# Patient Record
Sex: Female | Born: 1965 | Race: White | Hispanic: No | Marital: Married | Smoking: Never smoker
Health system: Southern US, Community
[De-identification: ages and names within clinical notes are randomized; demographics above are authoritative.]

## PROBLEM LIST (undated history)

## (undated) DIAGNOSIS — F32A Depression, unspecified: Secondary | ICD-10-CM

## (undated) DIAGNOSIS — J302 Other seasonal allergic rhinitis: Secondary | ICD-10-CM

## (undated) DIAGNOSIS — F329 Major depressive disorder, single episode, unspecified: Secondary | ICD-10-CM

## (undated) DIAGNOSIS — F419 Anxiety disorder, unspecified: Secondary | ICD-10-CM

## (undated) DIAGNOSIS — I10 Essential (primary) hypertension: Secondary | ICD-10-CM

## (undated) DIAGNOSIS — K219 Gastro-esophageal reflux disease without esophagitis: Secondary | ICD-10-CM

## (undated) HISTORY — PX: KNEE SURGERY: SHX244

## (undated) HISTORY — DX: Gastro-esophageal reflux disease without esophagitis: K21.9

## (undated) HISTORY — DX: Major depressive disorder, single episode, unspecified: F32.9

## (undated) HISTORY — PX: ANKLE SURGERY: SHX546

## (undated) HISTORY — DX: Anxiety disorder, unspecified: F41.9

## (undated) HISTORY — DX: Other seasonal allergic rhinitis: J30.2

## (undated) HISTORY — PX: TONSILLECTOMY: SUR1361

## (undated) HISTORY — DX: Depression, unspecified: F32.A

---

## 1999-06-12 ENCOUNTER — Other Ambulatory Visit: Admission: RE | Admit: 1999-06-12 | Discharge: 1999-06-12 | Payer: Self-pay | Admitting: Family Medicine

## 2004-03-16 ENCOUNTER — Other Ambulatory Visit: Admission: RE | Admit: 2004-03-16 | Discharge: 2004-03-16 | Payer: Self-pay | Admitting: *Deleted

## 2005-04-04 ENCOUNTER — Other Ambulatory Visit: Admission: RE | Admit: 2005-04-04 | Discharge: 2005-04-04 | Payer: Self-pay | Admitting: *Deleted

## 2006-09-25 ENCOUNTER — Other Ambulatory Visit: Admission: RE | Admit: 2006-09-25 | Discharge: 2006-09-25 | Payer: Self-pay | Admitting: Obstetrics and Gynecology

## 2007-09-25 ENCOUNTER — Other Ambulatory Visit: Admission: RE | Admit: 2007-09-25 | Discharge: 2007-09-25 | Payer: Self-pay | Admitting: *Deleted

## 2008-09-27 ENCOUNTER — Other Ambulatory Visit: Admission: RE | Admit: 2008-09-27 | Discharge: 2008-09-27 | Payer: Self-pay | Admitting: Family Medicine

## 2009-10-05 ENCOUNTER — Other Ambulatory Visit: Admission: RE | Admit: 2009-10-05 | Discharge: 2009-10-05 | Payer: Self-pay | Admitting: Family Medicine

## 2010-12-12 ENCOUNTER — Encounter
Admission: RE | Admit: 2010-12-12 | Discharge: 2010-12-12 | Payer: Self-pay | Source: Home / Self Care | Attending: Family Medicine | Admitting: Family Medicine

## 2010-12-20 ENCOUNTER — Encounter: Payer: Self-pay | Admitting: Family Medicine

## 2011-11-02 ENCOUNTER — Other Ambulatory Visit: Payer: Self-pay | Admitting: Family Medicine

## 2011-11-02 ENCOUNTER — Other Ambulatory Visit (HOSPITAL_COMMUNITY)
Admission: RE | Admit: 2011-11-02 | Discharge: 2011-11-02 | Disposition: A | Discharge status: Home / Self Care | Payer: Commercial Indemnity | Source: Ambulatory Visit | Attending: Family Medicine | Admitting: Family Medicine

## 2011-11-02 DIAGNOSIS — Z124 Encounter for screening for malignant neoplasm of cervix: Secondary | ICD-10-CM | POA: Insufficient documentation

## 2011-11-02 DIAGNOSIS — Z1159 Encounter for screening for other viral diseases: Secondary | ICD-10-CM | POA: Insufficient documentation

## 2012-11-05 ENCOUNTER — Other Ambulatory Visit (HOSPITAL_COMMUNITY)
Admission: RE | Admit: 2012-11-05 | Discharge: 2012-11-05 | Disposition: A | Discharge status: Home / Self Care | Payer: Commercial Indemnity | Source: Ambulatory Visit | Attending: Family Medicine | Admitting: Family Medicine

## 2012-11-05 ENCOUNTER — Other Ambulatory Visit: Payer: Self-pay | Admitting: Family Medicine

## 2012-11-05 DIAGNOSIS — Z124 Encounter for screening for malignant neoplasm of cervix: Secondary | ICD-10-CM | POA: Insufficient documentation

## 2012-11-05 DIAGNOSIS — Z1151 Encounter for screening for human papillomavirus (HPV): Secondary | ICD-10-CM | POA: Insufficient documentation

## 2013-05-06 ENCOUNTER — Other Ambulatory Visit: Payer: Self-pay | Admitting: Family Medicine

## 2013-05-06 DIAGNOSIS — Z1231 Encounter for screening mammogram for malignant neoplasm of breast: Secondary | ICD-10-CM

## 2013-05-21 ENCOUNTER — Ambulatory Visit
Admission: RE | Admit: 2013-05-21 | Discharge: 2013-05-21 | Disposition: A | Discharge status: Home / Self Care | Payer: Commercial Indemnity | Source: Ambulatory Visit | Attending: Family Medicine | Admitting: Family Medicine

## 2013-05-21 ENCOUNTER — Ambulatory Visit: Payer: Commercial Indemnity

## 2013-05-21 DIAGNOSIS — Z1231 Encounter for screening mammogram for malignant neoplasm of breast: Secondary | ICD-10-CM

## 2013-11-23 ENCOUNTER — Other Ambulatory Visit (HOSPITAL_COMMUNITY)
Admission: RE | Admit: 2013-11-23 | Discharge: 2013-11-23 | Disposition: A | Discharge status: Home / Self Care | Payer: Commercial Indemnity | Source: Ambulatory Visit | Attending: Family Medicine | Admitting: Family Medicine

## 2013-11-23 ENCOUNTER — Other Ambulatory Visit: Payer: Self-pay | Admitting: Family Medicine

## 2013-11-23 DIAGNOSIS — Z124 Encounter for screening for malignant neoplasm of cervix: Secondary | ICD-10-CM | POA: Insufficient documentation

## 2014-06-30 ENCOUNTER — Other Ambulatory Visit: Payer: Self-pay

## 2014-06-30 DIAGNOSIS — Z1231 Encounter for screening mammogram for malignant neoplasm of breast: Secondary | ICD-10-CM

## 2014-07-07 ENCOUNTER — Ambulatory Visit
Admission: RE | Admit: 2014-07-07 | Discharge: 2014-07-07 | Disposition: A | Discharge status: Home / Self Care | Payer: BC Managed Care – PPO | Source: Ambulatory Visit

## 2014-07-07 DIAGNOSIS — Z1231 Encounter for screening mammogram for malignant neoplasm of breast: Secondary | ICD-10-CM

## 2014-07-13 ENCOUNTER — Other Ambulatory Visit: Payer: Self-pay | Admitting: Family Medicine

## 2014-07-13 DIAGNOSIS — R928 Other abnormal and inconclusive findings on diagnostic imaging of breast: Secondary | ICD-10-CM

## 2014-07-20 ENCOUNTER — Ambulatory Visit
Admission: RE | Admit: 2014-07-20 | Discharge: 2014-07-20 | Disposition: A | Discharge status: Home / Self Care | Payer: BC Managed Care – PPO | Source: Ambulatory Visit | Attending: Family Medicine | Admitting: Family Medicine

## 2014-07-20 DIAGNOSIS — R928 Other abnormal and inconclusive findings on diagnostic imaging of breast: Secondary | ICD-10-CM

## 2015-12-23 ENCOUNTER — Other Ambulatory Visit: Payer: Self-pay

## 2015-12-23 DIAGNOSIS — Z1231 Encounter for screening mammogram for malignant neoplasm of breast: Secondary | ICD-10-CM

## 2015-12-30 ENCOUNTER — Other Ambulatory Visit: Payer: Self-pay | Admitting: Family Medicine

## 2015-12-30 ENCOUNTER — Other Ambulatory Visit (HOSPITAL_COMMUNITY)
Admission: RE | Admit: 2015-12-30 | Discharge: 2015-12-30 | Disposition: A | Discharge status: Home / Self Care | Payer: BC Managed Care – PPO | Source: Ambulatory Visit | Attending: Family Medicine | Admitting: Family Medicine

## 2015-12-30 DIAGNOSIS — Z01419 Encounter for gynecological examination (general) (routine) without abnormal findings: Secondary | ICD-10-CM | POA: Diagnosis present

## 2015-12-30 DIAGNOSIS — Z1151 Encounter for screening for human papillomavirus (HPV): Secondary | ICD-10-CM | POA: Diagnosis not present

## 2016-01-02 ENCOUNTER — Other Ambulatory Visit: Payer: Self-pay | Admitting: Gastroenterology

## 2016-01-03 LAB — CYTOLOGY - PAP

## 2016-01-04 ENCOUNTER — Ambulatory Visit
Admission: RE | Admit: 2016-01-04 | Discharge: 2016-01-04 | Disposition: A | Discharge status: Home / Self Care | Payer: Self-pay | Source: Ambulatory Visit

## 2016-01-04 DIAGNOSIS — Z1231 Encounter for screening mammogram for malignant neoplasm of breast: Secondary | ICD-10-CM

## 2016-10-02 DIAGNOSIS — J069 Acute upper respiratory infection, unspecified: Secondary | ICD-10-CM | POA: Diagnosis not present

## 2016-12-19 ENCOUNTER — Other Ambulatory Visit: Payer: Self-pay | Admitting: Family Medicine

## 2016-12-19 DIAGNOSIS — Z1231 Encounter for screening mammogram for malignant neoplasm of breast: Secondary | ICD-10-CM

## 2017-01-07 ENCOUNTER — Ambulatory Visit: Payer: BC Managed Care – PPO

## 2017-01-07 ENCOUNTER — Ambulatory Visit: Payer: Self-pay

## 2017-01-22 DIAGNOSIS — L239 Allergic contact dermatitis, unspecified cause: Secondary | ICD-10-CM | POA: Diagnosis not present

## 2017-01-22 DIAGNOSIS — Z0001 Encounter for general adult medical examination with abnormal findings: Secondary | ICD-10-CM | POA: Diagnosis not present

## 2017-01-22 DIAGNOSIS — J309 Allergic rhinitis, unspecified: Secondary | ICD-10-CM | POA: Diagnosis not present

## 2017-01-22 DIAGNOSIS — K219 Gastro-esophageal reflux disease without esophagitis: Secondary | ICD-10-CM | POA: Diagnosis not present

## 2017-01-22 DIAGNOSIS — F411 Generalized anxiety disorder: Secondary | ICD-10-CM | POA: Diagnosis not present

## 2017-01-22 DIAGNOSIS — E782 Mixed hyperlipidemia: Secondary | ICD-10-CM | POA: Diagnosis not present

## 2017-01-24 ENCOUNTER — Other Ambulatory Visit: Payer: Self-pay | Admitting: Family Medicine

## 2017-01-24 ENCOUNTER — Ambulatory Visit
Admission: RE | Admit: 2017-01-24 | Discharge: 2017-01-24 | Disposition: A | Discharge status: Home / Self Care | Payer: BLUE CROSS/BLUE SHIELD | Source: Ambulatory Visit | Attending: Family Medicine | Admitting: Family Medicine

## 2017-01-24 DIAGNOSIS — Z1231 Encounter for screening mammogram for malignant neoplasm of breast: Secondary | ICD-10-CM

## 2017-04-16 ENCOUNTER — Ambulatory Visit (INDEPENDENT_AMBULATORY_CARE_PROVIDER_SITE_OTHER): Payer: BLUE CROSS/BLUE SHIELD | Admitting: Obstetrics & Gynecology

## 2017-04-16 ENCOUNTER — Encounter: Payer: Self-pay | Admitting: Obstetrics & Gynecology

## 2017-04-16 VITALS — BP 138/90 | Ht 61.5 in | Wt 205.0 lb

## 2017-04-16 DIAGNOSIS — Z1151 Encounter for screening for human papillomavirus (HPV): Secondary | ICD-10-CM

## 2017-04-16 DIAGNOSIS — N950 Postmenopausal bleeding: Secondary | ICD-10-CM | POA: Diagnosis not present

## 2017-04-16 DIAGNOSIS — Z01411 Encounter for gynecological examination (general) (routine) with abnormal findings: Secondary | ICD-10-CM | POA: Diagnosis not present

## 2017-04-16 DIAGNOSIS — E669 Obesity, unspecified: Secondary | ICD-10-CM

## 2017-04-16 LAB — TSH: TSH: 1.95 mIU/L

## 2017-04-16 NOTE — Progress Notes (Signed)
Lisa Terry 03/03/1966 [id number]   History:    51 y.o.  G2P2L3 married x 30+ yrs.  3 sons doing well.  Has 8 grand-children  RP:  New patient presenting for annual gyn exam and PMB  HPI:  Menopause x 2 yrs with mild hot flashes/night sweats, but well tolerated without HRT.  X 2 weeks, has vaginal spotting, then vaginal bleeding like a rather heavy period.  Severe pelvic cramps at that time.  No current pelvic pain.  Vaginal bleeding now resolved.  Obesity with BMI at 38.11.  Breasts wnl.  Mictions normal.  BMs normal.  Past medical history,surgical history, family history and social history were all reviewed and documented in the EPIC chart.  Gynecologic History No LMP recorded. Patient is postmenopausal. Contraception: post menopausal status Last Pap: 2017. Results were: normal Last mammogram: 2018. Results were: normal  Obstetric History OB History  Gravida Para Term Preterm AB Living  2 2       3   SAB TAB Ectopic Multiple Live Births        1      # Outcome Date GA Lbr Len/2nd Weight Sex Delivery Anes PTL Lv  2 Para           1 Para                ROS: A ROS was performed and pertinent positives and negatives are included in the history.  GENERAL: No fevers or chills. HEENT: No change in vision, no earache, sore throat or sinus congestion. NECK: No pain or stiffness. CARDIOVASCULAR: No chest pain or pressure. No palpitations. PULMONARY: No shortness of breath, cough or wheeze. GASTROINTESTINAL: No abdominal pain, nausea, vomiting or diarrhea, melena or bright red blood per rectum. GENITOURINARY: No urinary frequency, urgency, hesitancy or dysuria. MUSCULOSKELETAL: No joint or muscle pain, no back pain, no recent trauma. DERMATOLOGIC: No rash, no itching, no lesions. ENDOCRINE: No polyuria, polydipsia, no heat or cold intolerance. No recent change in weight. HEMATOLOGICAL: No anemia or easy bruising or bleeding. NEUROLOGIC: No headache, seizures, numbness, tingling or weakness.  PSYCHIATRIC: No depression, no loss of interest in normal activity or change in sleep pattern.     Exam:   BP 138/90   Ht 5' 1.5" (1.562 m)   Wt 205 lb (93 kg)   BMI 38.11 kg/m   Body mass index is 38.11 kg/m.  General appearance : Well developed well nourished female. No acute distress HEENT: Eyes: no retinal hemorrhage or exudates,  Neck supple, trachea midline, no carotid bruits, no thyroidmegaly Lungs: Clear to auscultation, no rhonchi or wheezes, or rib retractions  Heart: Regular rate and rhythm, no murmurs or gallops Breast:Examined in sitting and supine position were symmetrical in appearance, no palpable masses or tenderness,  no skin retraction, no nipple inversion, no nipple discharge, no skin discoloration, no axillary or supraclavicular lymphadenopathy Abdomen: no palpable masses or tenderness, no rebound or guarding Extremities: no edema or skin discoloration or tenderness  Pelvic:  Bartholin, Urethra, Skene Glands: Within normal limits             Vagina: No gross lesions or discharge  Cervix: No gross lesions or discharge.  Pap/HPV done.  Uterus  AV, normal size, shape and consistency, non-tender and mobile  Adnexa  Without masses or tenderness  Anus and perineum  normal    Assessment/Plan:  51 y.o. female for annual exam   1. Encounter for gynecological examination with abnormal finding Normal gyn exam except  mild menopausal atrophic vaginitis.  Pap/HPV done.  2. PMB (postmenopausal bleeding) Causes of PMB including fibroids, polyps, endometrial pathology and isolated ovulation discussed.  Investigation reviewed with Pelvic US at f/u with EBx if Endometrial line >5 mm.   - FSH - TSH - US Transvaginal Non-OB; Future  3. Obesity BMI 38+ Low carb diet and physical activity reviewed.  Counseling on above issues >50% x 20 minutes.  Princess Bruins MD, 9:05 AM 04/16/2017

## 2017-04-16 NOTE — Patient Instructions (Addendum)
1. Encounter for gynecological examination with abnormal finding Normal gyn exam except mild menopausal atrophic vaginitis.  Pap/HPV done.  2. PMB (postmenopausal bleeding) Causes of PMB including fibroids, polyps, endometrial pathology and isolated ovulation discussed.  Investigation reviewed with Pelvic US at f/u with EBx if Endometrial line >5 mm.   - FSH - TSH - US Transvaginal Non-OB; Future  3. Obesity BMI 38+ Low carb diet and physical activity reviewed.  Lisa Terry, it was a pleasure to meet you today!  See you soon with the Pelvic US.  I will inform you of the results on the labs and pap test as soon as available.

## 2017-04-16 NOTE — Addendum Note (Signed)
Addended by: Thurnell Garbe A on: 04/16/2017 09:49 AM   Modules accepted: Orders

## 2017-04-17 LAB — FOLLICLE STIMULATING HORMONE: FSH: 51.4 m[IU]/mL

## 2017-04-19 LAB — PAP, TP IMAGING W/ HPV RNA, RFLX HPV TYPE 16,18/45: HPV mRNA, High Risk: NOT DETECTED

## 2017-04-24 ENCOUNTER — Ambulatory Visit (INDEPENDENT_AMBULATORY_CARE_PROVIDER_SITE_OTHER): Payer: BLUE CROSS/BLUE SHIELD | Admitting: Obstetrics & Gynecology

## 2017-04-24 ENCOUNTER — Other Ambulatory Visit: Payer: Self-pay | Admitting: Obstetrics & Gynecology

## 2017-04-24 ENCOUNTER — Ambulatory Visit (INDEPENDENT_AMBULATORY_CARE_PROVIDER_SITE_OTHER): Payer: BLUE CROSS/BLUE SHIELD

## 2017-04-24 DIAGNOSIS — R938 Abnormal findings on diagnostic imaging of other specified body structures: Secondary | ICD-10-CM

## 2017-04-24 DIAGNOSIS — N950 Postmenopausal bleeding: Secondary | ICD-10-CM

## 2017-04-24 DIAGNOSIS — D251 Intramural leiomyoma of uterus: Secondary | ICD-10-CM

## 2017-04-24 DIAGNOSIS — R9389 Abnormal findings on diagnostic imaging of other specified body structures: Secondary | ICD-10-CM

## 2017-04-24 NOTE — Patient Instructions (Signed)
1. Post-menopausal bleeding Thickened Endometrium with focal area.  EBx pending.    2. Thickened endometrium EBx done today, no Cx.  Focus by [place].  F/U Sonohysto r/o polyp.  Good to see you today Lisa Terry.  I will inform you of the results as soon as available.

## 2017-04-24 NOTE — Progress Notes (Signed)
    Lisa Terry 08-08-1966 [id number]        51 y.o.  G2P2  RP:  PMB for Pelvic US/EBx  Past medical history,surgical history, problem list, medications, allergies, family history and social history were all reviewed and documented in the EPIC chart.  Directed ROS with pertinent positives and negatives documented in the history of present illness/assessment and plan.  Exam:  There were no vitals filed for this visit. General appearance:  Normal  Pelvic US today:  T/V and T/A:  Anteverted Uterus with thickened endometrial line at 12.5 mm.  A 9 mm focus is present, but CFD neg.  Intramural fibroid 10 x 10 mm.  Right Ovary normal.  Left Ovary located behind Uterus, seen by T/A, normal.  No FF in CDS.  Endometrial Bx done after Betadine prep and Hurricane spray.  Tenaculum applied on anterior lip of cervix.  Canula entered easily.  Good specimen.  Sent to patho.  Instruments removed from vagina.  Well tolerated.  Assessment/Plan:  51 y.o. G2P2  1. Post-menopausal bleeding Thickened Endometrium with focal area.  EBx pending.    2. Thickened endometrium EBx done today, no Cx.  Focus by [place].  F/U Sonohysto r/o polyp.  Counseling on above issues >50% x 15 minutes.  Princess Bruins MD, 10:25 AM 04/24/2017

## 2017-05-15 ENCOUNTER — Ambulatory Visit (INDEPENDENT_AMBULATORY_CARE_PROVIDER_SITE_OTHER): Payer: BLUE CROSS/BLUE SHIELD

## 2017-05-15 ENCOUNTER — Ambulatory Visit (INDEPENDENT_AMBULATORY_CARE_PROVIDER_SITE_OTHER): Payer: BLUE CROSS/BLUE SHIELD | Admitting: Obstetrics & Gynecology

## 2017-05-15 DIAGNOSIS — R938 Abnormal findings on diagnostic imaging of other specified body structures: Secondary | ICD-10-CM

## 2017-05-15 DIAGNOSIS — N950 Postmenopausal bleeding: Secondary | ICD-10-CM

## 2017-05-15 DIAGNOSIS — R9389 Abnormal findings on diagnostic imaging of other specified body structures: Secondary | ICD-10-CM

## 2017-05-15 NOTE — Patient Instructions (Signed)
1. Thickened endometrium EBx benign.  Sonohysto:  No IU defect.  Patient reassured.  No complication.  F/U Annual/Gyn exam.  Good to see you today!

## 2017-05-15 NOTE — Progress Notes (Signed)
    Lisa Terry 09-27-1966 [id number]        51 y.o.  G2P2   RP:  Sonohysto for Thickened endometrium with focus on [place]  HPI:  No PMB x last visit.  No pelvic pain.  Past medical history,surgical history, problem list, medications, allergies, family history and social history were all reviewed and documented in the EPIC chart.  Directed ROS with pertinent positives and negatives documented in the history of present illness/assessment and plan.  Exam:  There were no vitals filed for this visit. General appearance:  Normal                                                                    Sono Infusion Hysterogram ( procedure note)   The initial transvaginal ultrasound demonstrated the following 04/24/2017: T/V and T/A:  Anteverted Uterus with thickened endometrial line at 12.5 mm.  A 9 mm focus is present, but CFD neg.  Intramural fibroid 10 x 10 mm.  Right Ovary normal.  Left Ovary located behind Uterus, seen by T/A, normal.  No FF in CDS.  EBx 04/24/2017: SCANTY SMALL FRAGMENTS OF BENIGN WEAKLY PROLIFERATIVE ENDOMETRIUM.  The speculum  was inserted and the cervix cleansed with Betadine solution after confirming that patient has no allergies and Hurricane was sprayed on the cervix.  The tenaculum was applied on the anterior lip of the cervix.  A small sonohysterography catheter was utilized.  Insertion was facilitated with ring forceps, using a spear-like motion the catheter was inserted to the fundus of the uterus. The tenaculum and speculum were then removed carefully to avoid dislodging the catheter. The catheter was flushed with sterile saline delete prior to insertion to rid it of small amounts of air.  The sterile saline solution was infused into the uterine cavity as a vaginal ultrasound probe was then placed in the vagina for full visualization of the uterine cavity from a transvaginal approach. The following was noted:  SonoHysto:  Following injection of saline, endometrium filled and  no defect seen.  Endometrial line 6.6 mm.   The catheter was then removed after retrieving some of the saline from the intrauterine cavity.  Patient tolerated procedure well. She had taken NSAIDs prior to procedure.  Assessment/Plan:  51 y.o. G2P2   1. Thickened endometrium EBx benign.  Sonohysto:  No IU defect.  Patient reassured.  No complication.  F/U Annual/Gyn exam.  Princess Bruins MD, 4:27 PM 05/15/2017

## 2017-07-24 DIAGNOSIS — F411 Generalized anxiety disorder: Secondary | ICD-10-CM | POA: Diagnosis not present

## 2017-07-24 DIAGNOSIS — L239 Allergic contact dermatitis, unspecified cause: Secondary | ICD-10-CM | POA: Diagnosis not present

## 2017-07-24 DIAGNOSIS — E782 Mixed hyperlipidemia: Secondary | ICD-10-CM | POA: Diagnosis not present

## 2017-07-24 DIAGNOSIS — J309 Allergic rhinitis, unspecified: Secondary | ICD-10-CM | POA: Diagnosis not present

## 2017-08-21 DIAGNOSIS — Z23 Encounter for immunization: Secondary | ICD-10-CM | POA: Diagnosis not present

## 2017-08-21 DIAGNOSIS — I10 Essential (primary) hypertension: Secondary | ICD-10-CM | POA: Diagnosis not present

## 2017-08-21 DIAGNOSIS — R7301 Impaired fasting glucose: Secondary | ICD-10-CM | POA: Diagnosis not present

## 2017-09-24 DIAGNOSIS — I10 Essential (primary) hypertension: Secondary | ICD-10-CM | POA: Diagnosis not present

## 2018-04-30 DIAGNOSIS — F411 Generalized anxiety disorder: Secondary | ICD-10-CM | POA: Diagnosis not present

## 2018-04-30 DIAGNOSIS — E782 Mixed hyperlipidemia: Secondary | ICD-10-CM | POA: Diagnosis not present

## 2018-04-30 DIAGNOSIS — I10 Essential (primary) hypertension: Secondary | ICD-10-CM | POA: Diagnosis not present

## 2018-04-30 DIAGNOSIS — K219 Gastro-esophageal reflux disease without esophagitis: Secondary | ICD-10-CM | POA: Diagnosis not present

## 2018-05-01 DIAGNOSIS — E782 Mixed hyperlipidemia: Secondary | ICD-10-CM | POA: Diagnosis not present

## 2018-05-01 DIAGNOSIS — I10 Essential (primary) hypertension: Secondary | ICD-10-CM | POA: Diagnosis not present

## 2018-07-30 DIAGNOSIS — E782 Mixed hyperlipidemia: Secondary | ICD-10-CM | POA: Diagnosis not present

## 2018-07-30 DIAGNOSIS — I10 Essential (primary) hypertension: Secondary | ICD-10-CM | POA: Diagnosis not present

## 2018-07-30 DIAGNOSIS — F411 Generalized anxiety disorder: Secondary | ICD-10-CM | POA: Diagnosis not present

## 2018-12-16 DIAGNOSIS — L718 Other rosacea: Secondary | ICD-10-CM | POA: Diagnosis not present

## 2018-12-16 DIAGNOSIS — L710 Perioral dermatitis: Secondary | ICD-10-CM | POA: Diagnosis not present

## 2019-01-27 DIAGNOSIS — L718 Other rosacea: Secondary | ICD-10-CM | POA: Diagnosis not present

## 2019-05-01 DIAGNOSIS — Z1159 Encounter for screening for other viral diseases: Secondary | ICD-10-CM | POA: Diagnosis not present

## 2019-05-01 DIAGNOSIS — K219 Gastro-esophageal reflux disease without esophagitis: Secondary | ICD-10-CM | POA: Diagnosis not present

## 2019-05-01 DIAGNOSIS — I10 Essential (primary) hypertension: Secondary | ICD-10-CM | POA: Diagnosis not present

## 2019-05-01 DIAGNOSIS — E782 Mixed hyperlipidemia: Secondary | ICD-10-CM | POA: Diagnosis not present

## 2019-05-01 DIAGNOSIS — F411 Generalized anxiety disorder: Secondary | ICD-10-CM | POA: Diagnosis not present

## 2019-05-01 DIAGNOSIS — R7301 Impaired fasting glucose: Secondary | ICD-10-CM | POA: Diagnosis not present

## 2019-05-14 ENCOUNTER — Other Ambulatory Visit: Payer: Self-pay | Admitting: Family Medicine

## 2019-05-14 DIAGNOSIS — Z1231 Encounter for screening mammogram for malignant neoplasm of breast: Secondary | ICD-10-CM

## 2019-06-24 ENCOUNTER — Other Ambulatory Visit: Payer: Self-pay

## 2019-06-24 ENCOUNTER — Ambulatory Visit
Admission: RE | Admit: 2019-06-24 | Discharge: 2019-06-24 | Disposition: A | Discharge status: Home / Self Care | Payer: BLUE CROSS/BLUE SHIELD | Source: Ambulatory Visit | Attending: Family Medicine | Admitting: Family Medicine

## 2019-06-24 DIAGNOSIS — Z1231 Encounter for screening mammogram for malignant neoplasm of breast: Secondary | ICD-10-CM | POA: Diagnosis not present

## 2019-10-12 DIAGNOSIS — Z20828 Contact with and (suspected) exposure to other viral communicable diseases: Secondary | ICD-10-CM | POA: Diagnosis not present

## 2019-11-27 DIAGNOSIS — R202 Paresthesia of skin: Secondary | ICD-10-CM | POA: Diagnosis not present

## 2020-01-23 ENCOUNTER — Ambulatory Visit: Payer: Self-pay | Attending: Internal Medicine

## 2020-01-23 DIAGNOSIS — Z23 Encounter for immunization: Secondary | ICD-10-CM | POA: Insufficient documentation

## 2020-01-23 NOTE — Progress Notes (Signed)
   Covid-19 Vaccination Clinic  Name:  Lisa Terry    MRN: [id number] DOB: 1966/01/10  01/23/2020  [name] was observed post Covid-19 immunization for 15 minutes without incident. She was provided with Vaccine Information Sheet and instruction to access the V-Safe system.   [name] was instructed to call 911 with any severe reactions post vaccine: Marland Kitchen Difficulty breathing  . Swelling of face and throat  . A fast heartbeat  . A bad rash all over body  . Dizziness and weakness   Immunizations Administered    Name Date Dose VIS Date Route   Pfizer COVID-19 Vaccine 01/23/2020 11:58 AM 0.3 mL 10/30/2019 Intramuscular   Manufacturer: [employer]   Lot: [id number]   [place]: [id number]

## 2020-01-28 DIAGNOSIS — L718 Other rosacea: Secondary | ICD-10-CM | POA: Diagnosis not present

## 2020-01-28 DIAGNOSIS — L2084 Intrinsic (allergic) eczema: Secondary | ICD-10-CM | POA: Diagnosis not present

## 2020-02-13 ENCOUNTER — Ambulatory Visit: Payer: Self-pay | Attending: Internal Medicine

## 2020-02-13 DIAGNOSIS — Z23 Encounter for immunization: Secondary | ICD-10-CM

## 2020-02-13 NOTE — Progress Notes (Signed)
   Covid-19 Vaccination Clinic  Name:  Malli Valk    MRN: [id number] DOB: [birth year]  02/13/2020  Ms. Rehl was observed post Covid-19 immunization for 15 minutes without incident. She was provided with Vaccine Information Sheet and instruction to access the V-Safe system.   Ms. Goudeau was instructed to call 911 with any severe reactions post vaccine: Marland Kitchen Difficulty breathing  . Swelling of face and throat  . A fast heartbeat  . A bad rash all over body  . Dizziness and weakness   Immunizations Administered    Name Date Dose VIS Date Route   Pfizer COVID-19 Vaccine 02/13/2020 12:10 PM 0.3 mL 10/30/2019 Intramuscular   Manufacturer: [employer]   Lot: [id number]   [place]: [id number]

## 2020-04-20 DIAGNOSIS — R202 Paresthesia of skin: Secondary | ICD-10-CM | POA: Diagnosis not present

## 2020-05-30 DIAGNOSIS — R7301 Impaired fasting glucose: Secondary | ICD-10-CM | POA: Diagnosis not present

## 2020-05-30 DIAGNOSIS — F411 Generalized anxiety disorder: Secondary | ICD-10-CM | POA: Diagnosis not present

## 2020-05-30 DIAGNOSIS — K219 Gastro-esophageal reflux disease without esophagitis: Secondary | ICD-10-CM | POA: Diagnosis not present

## 2020-05-30 DIAGNOSIS — E782 Mixed hyperlipidemia: Secondary | ICD-10-CM | POA: Diagnosis not present

## 2020-05-30 DIAGNOSIS — Z79899 Other long term (current) drug therapy: Secondary | ICD-10-CM | POA: Diagnosis not present

## 2020-05-30 DIAGNOSIS — I10 Essential (primary) hypertension: Secondary | ICD-10-CM | POA: Diagnosis not present

## 2020-06-07 ENCOUNTER — Ambulatory Visit (INDEPENDENT_AMBULATORY_CARE_PROVIDER_SITE_OTHER): Payer: BC Managed Care – PPO | Admitting: Neurology

## 2020-06-07 ENCOUNTER — Encounter (INDEPENDENT_AMBULATORY_CARE_PROVIDER_SITE_OTHER): Payer: BC Managed Care – PPO | Admitting: Neurology

## 2020-06-07 ENCOUNTER — Other Ambulatory Visit: Payer: Self-pay

## 2020-06-07 DIAGNOSIS — R200 Anesthesia of skin: Secondary | ICD-10-CM

## 2020-06-07 DIAGNOSIS — Z0289 Encounter for other administrative examinations: Secondary | ICD-10-CM

## 2020-06-07 DIAGNOSIS — G5603 Carpal tunnel syndrome, bilateral upper limbs: Secondary | ICD-10-CM | POA: Diagnosis not present

## 2020-06-07 NOTE — Progress Notes (Signed)
Full Name: Lisa Terry Gender: Female MRN #: [id number] Date of Birth: 07-06-1966    Visit Date: 06/07/2020 09:44 Age: 54 Years Examining Physician: Arlice Colt, MD  Referring Physician: Kathyrn Lass, MD    History: [name] is a 54 year old woman with bilateral hand numbness and pain, right greater than left.  She does not note much neck pain or pain above the wrist.  On exam, she had mildly reduced APB strength, right worse than left.  Sensation was symmetric and normal.  She had mild Tinel signs at the wrist.  Nerve conduction studies: Bilateral median motor responses had delayed distal latencies and reduced amplitudes with normal forearm conduction, right worse than left.  The right ulnar motor response was normal.  Bilateral median sensory responses were absent.  The right ulnar sensory response was normal.  Right ulnar F-wave latency was normal.  Electromyography: Needle EMG of selected muscles of the right arm and left hand was performed.  Motor unit morphology and recruitment was normal in all of the muscles tested.  There was no abnormal spontaneous activity.  Impression: This NCV/EMG study shows the following: 1.   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 2.   No evidence of any superimposed radiculopathy  [name] A. [name], MD, [name], FAAN Certified in Neurology, Clinical Neurophysiology, Sleep Medicine, Pain Medicine and Neuroimaging Director, [facility] [address], [place], [place] [address] [phone]    Verbal informed consent was obtained from the patient, patient was informed of potential risk of procedure, including bruising, bleeding, hematoma formation, infection, muscle weakness, muscle pain, numbness, among others.        [name]    Nerve / Sites Muscle Latency Ref. Amplitude Ref. Rel Amp  Segments Distance Velocity Ref. Area    ms ms mV mV %  cm m/s m/s mVms  R Median - APB     Wrist APB 7.7 ?4.4 2.2 ?4.0 100 Wrist - APB 7   11.8     Upper arm APB 11.6  1.3  58.7 Upper arm - Wrist 19 49 ?49 7.3  L Median - APB     Wrist APB 6.6 ?4.4 3.4 ?4.0 100 Wrist - APB 7   12.1     Upper arm APB 9.9  1.7  48.8 Upper arm - Wrist 18 54 ?49 7.8  R Ulnar - ADM     Wrist ADM 2.5 ?3.3 7.5 ?6.0 100 Wrist - ADM 7   13.4     B.Elbow ADM 5.0  8.4  112 B.Elbow - Wrist 18 71 ?49 17.8     A.Elbow ADM 6.4  8.4  100 A.Elbow - B.Elbow 10 71 ?49 19.4           SNC    Nerve / Sites Rec. Site Peak Lat Ref.  Amp Ref. Segments Distance    ms ms V V  cm  R Median - Orthodromic (Dig II, Mid palm)     Dig II Wrist NR ?3.4 NR ?10 Dig II - Wrist 13  L Median - Orthodromic (Dig II, Mid palm)     Dig II Wrist NR ?3.4 NR ?10 Dig II - Wrist 13  R Ulnar - Orthodromic, (Dig V, Mid palm)     Dig V Wrist 2.5 ?3.1 5 ?5 Dig V - Wrist 11  F  Wave    Nerve F Lat Ref.   ms ms  R Ulnar - ADM 24.0 ?32.0       EMG Summary Table    Spontaneous MUAP Recruitment  Muscle IA Fib PSW Fasc Other Amp Dur. Poly Pattern  R. Deltoid Normal None None None _______ Normal Normal Normal Normal  R. Triceps brachii Normal None None None _______ Normal Normal Normal Normal  R. Biceps brachii Normal None None None _______ Normal Normal Normal Normal  R. Extensor digitorum communis Normal None None None _______ Normal Normal Normal Normal  R. Abductor pollicis brevis Normal None None None _______ Normal Normal Normal Normal  R. First dorsal interosseous Normal None None None _______ Normal Normal Normal Normal  L. First dorsal interosseous Normal None None None _______ Normal Normal Normal Normal  L. Abductor pollicis brevis Normal None None None _______ Normal Normal Normal Normal

## 2020-07-27 ENCOUNTER — Other Ambulatory Visit: Payer: Self-pay | Admitting: Family Medicine

## 2020-07-27 DIAGNOSIS — Z1231 Encounter for screening mammogram for malignant neoplasm of breast: Secondary | ICD-10-CM

## 2020-07-29 ENCOUNTER — Ambulatory Visit
Admission: RE | Admit: 2020-07-29 | Discharge: 2020-07-29 | Disposition: A | Discharge status: Home / Self Care | Payer: BC Managed Care – PPO | Source: Ambulatory Visit | Attending: Family Medicine | Admitting: Family Medicine

## 2020-07-29 ENCOUNTER — Other Ambulatory Visit: Payer: Self-pay

## 2020-07-29 DIAGNOSIS — Z1231 Encounter for screening mammogram for malignant neoplasm of breast: Secondary | ICD-10-CM

## 2021-01-27 DIAGNOSIS — L718 Other rosacea: Secondary | ICD-10-CM | POA: Diagnosis not present

## 2021-01-27 DIAGNOSIS — L2084 Intrinsic (allergic) eczema: Secondary | ICD-10-CM | POA: Diagnosis not present

## 2021-03-17 DIAGNOSIS — K219 Gastro-esophageal reflux disease without esophagitis: Secondary | ICD-10-CM | POA: Diagnosis not present

## 2021-03-17 DIAGNOSIS — Z86010 Personal history of colonic polyps: Secondary | ICD-10-CM | POA: Diagnosis not present

## 2021-05-16 DIAGNOSIS — M25562 Pain in left knee: Secondary | ICD-10-CM | POA: Diagnosis not present

## 2021-05-16 DIAGNOSIS — M1712 Unilateral primary osteoarthritis, left knee: Secondary | ICD-10-CM | POA: Diagnosis not present

## 2021-05-26 DIAGNOSIS — D120 Benign neoplasm of cecum: Secondary | ICD-10-CM | POA: Diagnosis not present

## 2021-05-26 DIAGNOSIS — D123 Benign neoplasm of transverse colon: Secondary | ICD-10-CM | POA: Diagnosis not present

## 2021-05-26 DIAGNOSIS — K5730 Diverticulosis of large intestine without perforation or abscess without bleeding: Secondary | ICD-10-CM | POA: Diagnosis not present

## 2021-05-26 DIAGNOSIS — D122 Benign neoplasm of ascending colon: Secondary | ICD-10-CM | POA: Diagnosis not present

## 2021-05-26 DIAGNOSIS — Z86010 Personal history of colonic polyps: Secondary | ICD-10-CM | POA: Diagnosis not present

## 2021-06-02 DIAGNOSIS — F411 Generalized anxiety disorder: Secondary | ICD-10-CM | POA: Diagnosis not present

## 2021-06-02 DIAGNOSIS — I10 Essential (primary) hypertension: Secondary | ICD-10-CM | POA: Diagnosis not present

## 2021-06-02 DIAGNOSIS — R7301 Impaired fasting glucose: Secondary | ICD-10-CM | POA: Diagnosis not present

## 2021-06-02 DIAGNOSIS — K219 Gastro-esophageal reflux disease without esophagitis: Secondary | ICD-10-CM | POA: Diagnosis not present

## 2021-06-02 DIAGNOSIS — J309 Allergic rhinitis, unspecified: Secondary | ICD-10-CM | POA: Diagnosis not present

## 2021-07-19 ENCOUNTER — Other Ambulatory Visit: Payer: Self-pay | Admitting: Family Medicine

## 2021-07-19 DIAGNOSIS — Z1231 Encounter for screening mammogram for malignant neoplasm of breast: Secondary | ICD-10-CM

## 2021-08-14 ENCOUNTER — Ambulatory Visit
Admission: RE | Admit: 2021-08-14 | Discharge: 2021-08-14 | Disposition: A | Discharge status: Home / Self Care | Payer: BC Managed Care – PPO | Source: Ambulatory Visit | Attending: Family Medicine | Admitting: Family Medicine

## 2021-08-14 ENCOUNTER — Other Ambulatory Visit: Payer: Self-pay

## 2021-08-14 DIAGNOSIS — Z1231 Encounter for screening mammogram for malignant neoplasm of breast: Secondary | ICD-10-CM

## 2021-09-06 DIAGNOSIS — R7301 Impaired fasting glucose: Secondary | ICD-10-CM | POA: Diagnosis not present

## 2021-09-06 DIAGNOSIS — Z23 Encounter for immunization: Secondary | ICD-10-CM | POA: Diagnosis not present

## 2021-09-06 DIAGNOSIS — I10 Essential (primary) hypertension: Secondary | ICD-10-CM | POA: Diagnosis not present

## 2021-10-06 ENCOUNTER — Encounter (HOSPITAL_BASED_OUTPATIENT_CLINIC_OR_DEPARTMENT_OTHER): Payer: Self-pay | Admitting: Emergency Medicine

## 2021-10-06 ENCOUNTER — Other Ambulatory Visit: Payer: Self-pay

## 2021-10-06 ENCOUNTER — Emergency Department (HOSPITAL_BASED_OUTPATIENT_CLINIC_OR_DEPARTMENT_OTHER)
Admission: EM | Admit: 2021-10-06 | Discharge: 2021-10-06 | Disposition: A | Discharge status: Home / Self Care | Payer: BC Managed Care – PPO | Attending: Emergency Medicine | Admitting: Emergency Medicine

## 2021-10-06 ENCOUNTER — Emergency Department (HOSPITAL_BASED_OUTPATIENT_CLINIC_OR_DEPARTMENT_OTHER): Payer: BC Managed Care – PPO

## 2021-10-06 DIAGNOSIS — I10 Essential (primary) hypertension: Secondary | ICD-10-CM | POA: Insufficient documentation

## 2021-10-06 DIAGNOSIS — R110 Nausea: Secondary | ICD-10-CM | POA: Insufficient documentation

## 2021-10-06 DIAGNOSIS — R42 Dizziness and giddiness: Secondary | ICD-10-CM | POA: Diagnosis not present

## 2021-10-06 HISTORY — DX: Essential (primary) hypertension: I10

## 2021-10-06 LAB — BASIC METABOLIC PANEL
Anion gap: 8 (ref 5–15)
BUN: 13 mg/dL (ref 6–20)
CO2: 27 mmol/L (ref 22–32)
Calcium: 10.2 mg/dL (ref 8.9–10.3)
Chloride: 105 mmol/L (ref 98–111)
Creatinine, Ser: 0.78 mg/dL (ref 0.44–1.00)
GFR, Estimated: >60 mL/min (ref >60)
Glucose, Bld: 153 mg/dL — ABNORMAL HIGH (ref 70–99)
Potassium: 4.2 mmol/L (ref 3.5–5.1)
Sodium: 140 mmol/L (ref 135–145)

## 2021-10-06 LAB — CBC WITH DIFFERENTIAL/PLATELET
Abs Immature Granulocytes: 0.04 10*3/uL (ref 0.00–0.07)
Basophils Absolute: 0 10*3/uL (ref 0.0–0.1)
Basophils Relative: 0 %
Eosinophils Absolute: 0.1 10*3/uL (ref 0.0–0.5)
Eosinophils Relative: 1 %
HCT: 45.6 % (ref 36.0–46.0)
Hemoglobin: 15.9 g/dL — ABNORMAL HIGH (ref 12.0–15.0)
Immature Granulocytes: 1 %
Lymphocytes Relative: 16 %
Lymphs Abs: 1.4 10*3/uL (ref 0.7–4.0)
MCH: 31.9 pg (ref 26.0–34.0)
MCHC: 34.9 g/dL (ref 30.0–36.0)
MCV: 91.4 fL (ref 80.0–100.0)
Monocytes Absolute: 0.2 10*3/uL (ref 0.1–1.0)
Monocytes Relative: 3 %
Neutro Abs: 6.8 10*3/uL (ref 1.7–7.7)
Neutrophils Relative %: 79 %
Platelets: 484 10*3/uL — ABNORMAL HIGH (ref 150–400)
RBC: 4.99 MIL/uL (ref 3.87–5.11)
RDW: 12.8 % (ref 11.5–15.5)
WBC: 8.6 10*3/uL (ref 4.0–10.5)
nRBC: 0 % (ref 0.0–0.2)

## 2021-10-06 LAB — CBG MONITORING, ED: Glucose-Capillary: 162 mg/dL — ABNORMAL HIGH (ref 70–99)

## 2021-10-06 MED ORDER — ONDANSETRON 4 MG PO TBDP: 4.0000 mg | ORAL_TABLET | Freq: Three times a day (TID) | ORAL | 0 refills | Status: AC | PRN

## 2021-10-06 MED ORDER — MECLIZINE HCL 25 MG PO TABS: 25.0000 mg | ORAL_TABLET | Freq: Three times a day (TID) | ORAL | 0 refills | Status: AC | PRN

## 2021-10-06 MED ORDER — SODIUM CHLORIDE 0.9 % IV BOLUS
1000.0000 mL | Freq: Once | INTRAVENOUS | Status: AC
  Administered 2021-10-06: 1000 mL via INTRAVENOUS

## 2021-10-06 MED ORDER — MECLIZINE HCL 25 MG PO TABS
25.0000 mg | ORAL_TABLET | Freq: Once | ORAL | Status: AC
  Administered 2021-10-06: 25 mg via ORAL
  Filled 2021-10-06: qty 1

## 2021-10-06 MED ORDER — DIAZEPAM 5 MG PO TABS
5.0000 mg | ORAL_TABLET | Freq: Once | ORAL | Status: AC
  Administered 2021-10-06: 5 mg via ORAL
  Filled 2021-10-06: qty 1

## 2021-10-06 MED ORDER — ONDANSETRON HCL 4 MG/2ML IJ SOLN
4.0000 mg | Freq: Once | INTRAMUSCULAR | Status: AC
  Administered 2021-10-06: 4 mg via INTRAVENOUS
  Filled 2021-10-06: qty 2

## 2021-10-06 NOTE — ED Triage Notes (Signed)
Pt arrives pov with c/o dizziness upon waking at 0645 today. Last normal at 0930. Pt aox4, bilaterally equal, denies HA, endorses nausea

## 2021-10-06 NOTE — ED Notes (Signed)
Pt's CBG result was 162. Informed Dr. Pearline Cables and [facility] - RN.

## 2021-10-06 NOTE — ED Notes (Signed)
IV removed, per [facility] manager.

## 2021-10-06 NOTE — ED Provider Notes (Signed)
[facility] DEPT Provider Note   CSN: [id number] Arrival date & time: 10/06/21  8916     History Chief Complaint  Patient presents with   Dizziness    Lisa Terry is a 55 y.o. female.  This is a 55 y.o. female with significant medical history as below, including anxiety, depression, anxiety who presents to the ED with complaint of dizziness.  Sudden onset of room spinning sensation this morning patient awoke from bed.  She does not experience similar symptoms in the past.  Symptoms have been constant since the onset worsen with head movement, eye movement, head turning.  Not associated with ear ringing, no headache, no neck pain.  No numbness or tingling.  Mild nausea without vomiting.  No abdominal pain.  No chest pain or dyspnea.  No fevers or chills.  No IV drug use.  No trauma, no recent spinal injections.   The history is provided by the patient and a relative. No language interpreter was used.  Dizziness Associated symptoms: nausea   Associated symptoms: no chest pain, no headaches, no palpitations, no shortness of breath and no vomiting       Past Medical History:  Diagnosis Date   Anxiety    Depression    GERD (gastroesophageal reflux disease)    Hypertension    Seasonal allergies     Patient Active Problem List   Diagnosis Date Noted   Carpal tunnel syndrome, bilateral 06/07/2020   Hand numbness 06/07/2020    Past Surgical History:  Procedure Laterality Date   ANKLE SURGERY Left    KNEE SURGERY Left    TONSILLECTOMY       OB History     Gravida  2   Para  2   Term      Preterm      AB      Living  3      SAB      IAB      Ectopic      Multiple  1   Live Births              Family History  Problem Relation Age of Onset   Emphysema Father    Heart attack Father     Social History   Tobacco Use   Smoking status: Never   Smokeless tobacco: Never  Vaping Use   Vaping Use: Never used  Substance  Use Topics   Alcohol use: Not Currently    Comment: 6 PACK A WEEK   Drug use: No    Home Medications Prior to Admission medications   Medication Sig Start Date End Date Taking? Authorizing Provider  meclizine (ANTIVERT) 25 MG tablet Take 1 tablet (25 mg total) by mouth 3 (three) times daily as needed for dizziness. 10/06/21  Yes Wynona Dove A, DO  ondansetron (ZOFRAN ODT) 4 MG disintegrating tablet Take 1 tablet (4 mg total) by mouth every 8 (eight) hours as needed for nausea or vomiting. 10/06/21  Yes Wynona Dove A, DO  azelastine (ASTELIN) 0.1 % nasal spray Place into both nostrils 2 (two) times daily. Use in each nostril as directed    [provider]  fenofibrate micronized (LOFIBRA) 67 MG capsule Take 67 mg by mouth daily before breakfast.    [provider]  fexofenadine-pseudoephedrine (ALLEGRA-D 24) 180-240 MG 24 hr tablet Take 1 tablet by mouth daily.    [provider]  fluticasone (VERAMYST) 27.5 MCG/SPRAY nasal spray Place 2 sprays into the nose  daily.    [provider]  omeprazole (PRILOSEC) 20 MG capsule Take 20 mg by mouth daily.    [provider]  venlafaxine (EFFEXOR) 75 MG tablet Take 75 mg by mouth 2 (two) times daily.    [provider]    Allergies    Codeine and Vicodin [hydrocodone-acetaminophen]  Review of Systems   Review of Systems  Constitutional:  Negative for chills and fever.  HENT:  Negative for facial swelling and trouble swallowing.   Eyes:  Negative for photophobia and visual disturbance.  Respiratory:  Negative for cough and shortness of breath.   Cardiovascular:  Negative for chest pain and palpitations.  Gastrointestinal:  Positive for nausea. Negative for abdominal pain and vomiting.  Endocrine: Negative for polydipsia and polyuria.  Genitourinary:  Negative for difficulty urinating and hematuria.  Musculoskeletal:  Negative for gait problem and joint swelling.  Skin:  Negative for pallor  and rash.  Neurological:  Positive for dizziness. Negative for syncope and headaches.  Psychiatric/Behavioral:  Negative for agitation and confusion.    Physical Exam Updated Vital Signs BP 105/79   Pulse 92   Temp 98.6 F (37 C) (Oral)   Resp 17   Ht 5\' 1"  (1.549 m)   Wt 95.3 kg   SpO2 99%   BMI 39.68 kg/m   Physical Exam Vitals and nursing note reviewed.  Constitutional:      General: She is not in acute distress.    Appearance: Normal appearance.  HENT:     Head: Normocephalic and atraumatic.     Right Ear: External ear normal.     Left Ear: External ear normal.     Nose: Nose normal.     Mouth/Throat:     Mouth: Mucous membranes are moist.  Eyes:     General: Vision grossly intact. Gaze aligned appropriately. No scleral icterus.       Right eye: No discharge.        Left eye: No discharge.     Extraocular Movements: Extraocular movements intact.     Right eye: Normal extraocular motion.     Left eye: Normal extraocular motion.     Pupils: Pupils are equal, round, and reactive to light.     Comments: Fatigable horizontal nystagmus  Neck:     Comments: No carotid bruit Cardiovascular:     Rate and Rhythm: Normal rate and regular rhythm.     Pulses: Normal pulses.     Heart sounds: Normal heart sounds.  Pulmonary:     Effort: Pulmonary effort is normal. No respiratory distress.     Breath sounds: Normal breath sounds.  Abdominal:     General: Abdomen is flat.     Tenderness: There is no abdominal tenderness.  Musculoskeletal:        General: Normal range of motion.     Cervical back: Full passive range of motion without pain and normal range of motion.     Right lower leg: No edema.     Left lower leg: No edema.  Skin:    General: Skin is warm and dry.     Capillary Refill: Capillary refill takes less than 2 seconds.  Neurological:     Mental Status: She is alert and oriented to person, place, and time. Mental status is at baseline.     GCS: GCS eye  subscore is 4. GCS verbal subscore is 5. GCS motor subscore is 6.     Cranial Nerves: Cranial nerves 2-12 are  intact. No dysarthria or facial asymmetry.     Sensory: Sensation is intact.     Motor: Motor function is intact. No tremor or seizure activity.     Coordination: Coordination is intact. Finger-Nose-Finger Test normal.     Comments: Acting at baseline per son at bedside.  Hints exam appears consistent with peripheral vertigo  Psychiatric:        Mood and Affect: Mood normal.        Behavior: Behavior normal.    ED Results / Procedures / Treatments   Labs (all labs ordered are listed, but only abnormal results are displayed) Labs Reviewed  CBC WITH DIFFERENTIAL/PLATELET - Abnormal; Notable for the following components:      Result Value   Hemoglobin 15.9 (*)    Platelets 484 (*)    All other components within normal limits  BASIC METABOLIC PANEL - Abnormal; Notable for the following components:   Glucose, Bld 153 (*)    All other components within normal limits  CBG MONITORING, ED - Abnormal; Notable for the following components:   Glucose-Capillary 162 (*)    All other components within normal limits    EKG EKG Interpretation  Date/Time:  Friday October 06 2021 08:49:54 EST Ventricular Rate:  81 PR Interval:  191 QRS Duration: 90 QT Interval:  416 QTC Calculation: 483 R Axis:   -10 Text Interpretation: Sinus rhythm Low voltage, precordial leads Abnormal T, consider ischemia, lateral leads TWI in aVL No STEMI No prior tracing available to compare Confirmed by Wynona Dove ([id number]) on 10/06/2021 10:09:59 AM  Radiology CT Head Wo Contrast  Result Date: 10/06/2021 CLINICAL DATA:  Dizziness EXAM: CT HEAD WITHOUT CONTRAST TECHNIQUE: Contiguous axial images were obtained from the base of the skull through the vertex without intravenous contrast. COMPARISON:  None. FINDINGS: Brain: Ventricles are not dilated. There is no shift of midline structures. There are no epidural or  subdural fluid collections. Cortical sulci are prominent. Vascular: Unremarkable. Skull: Unremarkable. Sinuses/Orbits: Unremarkable. Other: None IMPRESSION: No acute intracranial findings are seen in noncontrast CT brain. There are no signs of bleeding. Cortical sulci are prominent suggesting atrophy. Electronically Signed   By: Elmer Picker M.D.   On: 10/06/2021 12:09    Procedures Procedures   Medications Ordered in ED Medications  sodium chloride 0.9 % bolus 1,000 mL (0 mLs Intravenous Stopped 10/06/21 1125)  meclizine (ANTIVERT) tablet 25 mg (25 mg Oral Given 10/06/21 1021)  ondansetron (ZOFRAN) injection 4 mg (4 mg Intravenous Given 10/06/21 1020)  diazepam (VALIUM) tablet 5 mg (5 mg Oral Given 10/06/21 1124)    ED Course  I have reviewed the triage vital signs and the nursing notes.  Pertinent labs & imaging results that were available during my care of the patient were reviewed by me and considered in my medical decision making (see chart for details).    MDM Rules/Calculators/A&P                           CC: dizziness  This patient complains of above; this involves an extensive number of treatment options and is a complaint that carries with it a high risk of complications and morbidity. Vital signs were reviewed. Serious etiologies considered.  Neurologic exam is nonfocal, hints exam consistent with peripheral vertigo.  Record review:  Previous records obtained and reviewed   Additional history obtained from family at bedside  Work up as above, notable for:  Labs & imaging results that  were available during my care of the patient were reviewed by me and considered in my medical decision making.   I ordered imaging studies which included CT head and I independently visualized and interpreted imaging which showed no acute process  Management: Given meclizine, IV fluids, Zofran, Valium  Reassessment:  Symptoms have resolved in their entirety.  Patient feels  back to her baseline.  She is able tolerate oral intake, she is ambulatory.  Discussed supportive care regarding vertigo, preventative measures, forward somersault, ENT follow-up as needed     Patient presents with vertigo. On initial evaluation patient appears in no acute distress, afebrile with normal vital signs. Vertigo most suggestive of peripheral cause. Neuro intact without sign of CNS ischemia or other serious etiology.   Patient in no distress and overall condition improved here in the ED. Detailed discussions were had with the patient regarding current findings, and need for close f/u with PCP or on call doctor. The patient has been instructed to return immediately if the symptoms worsen in any way for re-evaluation. Patient verbalized understanding and is in agreement with current care plan. All questions answered prior to discharge.      This chart was dictated using voice recognition software.  Despite best efforts to proofread,  errors can occur which can change the documentation meaning.  Final Clinical Impression(s) / ED Diagnoses Final diagnoses:  Vertigo    Rx / DC Orders ED Discharge Orders          Ordered    meclizine (ANTIVERT) 25 MG tablet  3 times daily PRN        10/06/21 1312    ondansetron (ZOFRAN ODT) 4 MG disintegrating tablet  Every 8 hours PRN        10/06/21 1312             Jeanell Sparrow, DO 10/06/21 1314

## 2021-10-06 NOTE — ED Notes (Signed)
Pt discharged to home. Discharge instructions have been discussed with patient and/or family members. Pt verbally acknowledges understanding d/c instructions, and endorses comprehension to checkout at registration before leaving.  °

## 2021-10-09 DIAGNOSIS — I10 Essential (primary) hypertension: Secondary | ICD-10-CM | POA: Diagnosis not present

## 2021-10-09 DIAGNOSIS — H8110 Benign paroxysmal vertigo, unspecified ear: Secondary | ICD-10-CM | POA: Diagnosis not present

## 2021-10-09 DIAGNOSIS — R002 Palpitations: Secondary | ICD-10-CM | POA: Diagnosis not present

## 2021-10-23 DIAGNOSIS — R42 Dizziness and giddiness: Secondary | ICD-10-CM | POA: Diagnosis not present

## 2021-12-21 DIAGNOSIS — M25561 Pain in right knee: Secondary | ICD-10-CM | POA: Diagnosis not present

## 2021-12-21 DIAGNOSIS — M1712 Unilateral primary osteoarthritis, left knee: Secondary | ICD-10-CM | POA: Diagnosis not present

## 2021-12-21 DIAGNOSIS — M25562 Pain in left knee: Secondary | ICD-10-CM | POA: Diagnosis not present

## 2021-12-27 DIAGNOSIS — I10 Essential (primary) hypertension: Secondary | ICD-10-CM | POA: Diagnosis not present

## 2021-12-27 DIAGNOSIS — M170 Bilateral primary osteoarthritis of knee: Secondary | ICD-10-CM | POA: Diagnosis not present

## 2022-03-07 DIAGNOSIS — M25561 Pain in right knee: Secondary | ICD-10-CM | POA: Diagnosis not present

## 2022-03-07 DIAGNOSIS — M1712 Unilateral primary osteoarthritis, left knee: Secondary | ICD-10-CM | POA: Diagnosis not present

## 2022-04-05 DIAGNOSIS — M13862 Other specified arthritis, left knee: Secondary | ICD-10-CM | POA: Diagnosis not present

## 2022-06-05 DIAGNOSIS — Z23 Encounter for immunization: Secondary | ICD-10-CM | POA: Diagnosis not present

## 2022-06-05 DIAGNOSIS — E669 Obesity, unspecified: Secondary | ICD-10-CM | POA: Diagnosis not present

## 2022-06-05 DIAGNOSIS — F411 Generalized anxiety disorder: Secondary | ICD-10-CM | POA: Diagnosis not present

## 2022-06-05 DIAGNOSIS — K219 Gastro-esophageal reflux disease without esophagitis: Secondary | ICD-10-CM | POA: Diagnosis not present

## 2022-06-05 DIAGNOSIS — I10 Essential (primary) hypertension: Secondary | ICD-10-CM | POA: Diagnosis not present

## 2022-07-02 ENCOUNTER — Other Ambulatory Visit: Payer: Self-pay | Admitting: Family Medicine

## 2022-07-02 DIAGNOSIS — Z1231 Encounter for screening mammogram for malignant neoplasm of breast: Secondary | ICD-10-CM

## 2022-07-31 DIAGNOSIS — M170 Bilateral primary osteoarthritis of knee: Secondary | ICD-10-CM | POA: Diagnosis not present

## 2022-08-20 ENCOUNTER — Ambulatory Visit
Admission: RE | Admit: 2022-08-20 | Discharge: 2022-08-20 | Disposition: A | Discharge status: Home / Self Care | Payer: BC Managed Care – PPO | Source: Ambulatory Visit | Attending: Family Medicine | Admitting: Family Medicine

## 2022-08-20 DIAGNOSIS — Z1231 Encounter for screening mammogram for malignant neoplasm of breast: Secondary | ICD-10-CM

## 2022-09-20 DIAGNOSIS — E782 Mixed hyperlipidemia: Secondary | ICD-10-CM | POA: Diagnosis not present

## 2022-11-01 DIAGNOSIS — M1712 Unilateral primary osteoarthritis, left knee: Secondary | ICD-10-CM | POA: Diagnosis not present

## 2022-11-01 DIAGNOSIS — Z0189 Encounter for other specified special examinations: Secondary | ICD-10-CM | POA: Diagnosis not present

## 2022-11-01 DIAGNOSIS — M21062 Valgus deformity, not elsewhere classified, left knee: Secondary | ICD-10-CM | POA: Diagnosis not present

## 2022-11-13 DIAGNOSIS — M1712 Unilateral primary osteoarthritis, left knee: Secondary | ICD-10-CM | POA: Diagnosis not present

## 2022-11-13 DIAGNOSIS — M21062 Valgus deformity, not elsewhere classified, left knee: Secondary | ICD-10-CM | POA: Diagnosis not present

## 2022-11-13 DIAGNOSIS — M25462 Effusion, left knee: Secondary | ICD-10-CM | POA: Diagnosis not present

## 2022-11-16 DIAGNOSIS — Z01812 Encounter for preprocedural laboratory examination: Secondary | ICD-10-CM | POA: Diagnosis not present

## 2022-11-20 DIAGNOSIS — M1712 Unilateral primary osteoarthritis, left knee: Secondary | ICD-10-CM | POA: Diagnosis not present

## 2022-12-03 DIAGNOSIS — G8918 Other acute postprocedural pain: Secondary | ICD-10-CM | POA: Diagnosis not present

## 2022-12-03 DIAGNOSIS — M1712 Unilateral primary osteoarthritis, left knee: Secondary | ICD-10-CM | POA: Diagnosis not present

## 2022-12-07 DIAGNOSIS — M6281 Muscle weakness (generalized): Secondary | ICD-10-CM | POA: Diagnosis not present

## 2022-12-07 DIAGNOSIS — S83412A Sprain of medial collateral ligament of left knee, initial encounter: Secondary | ICD-10-CM | POA: Diagnosis not present

## 2022-12-07 DIAGNOSIS — Z96652 Presence of left artificial knee joint: Secondary | ICD-10-CM | POA: Diagnosis not present

## 2022-12-07 DIAGNOSIS — R600 Localized edema: Secondary | ICD-10-CM | POA: Diagnosis not present

## 2022-12-19 DIAGNOSIS — M6281 Muscle weakness (generalized): Secondary | ICD-10-CM | POA: Diagnosis not present

## 2022-12-19 DIAGNOSIS — Z96652 Presence of left artificial knee joint: Secondary | ICD-10-CM | POA: Diagnosis not present

## 2022-12-19 DIAGNOSIS — S83412A Sprain of medial collateral ligament of left knee, initial encounter: Secondary | ICD-10-CM | POA: Diagnosis not present

## 2022-12-19 DIAGNOSIS — R600 Localized edema: Secondary | ICD-10-CM | POA: Diagnosis not present

## 2022-12-21 DIAGNOSIS — R600 Localized edema: Secondary | ICD-10-CM | POA: Diagnosis not present

## 2022-12-21 DIAGNOSIS — S83412A Sprain of medial collateral ligament of left knee, initial encounter: Secondary | ICD-10-CM | POA: Diagnosis not present

## 2022-12-21 DIAGNOSIS — M6281 Muscle weakness (generalized): Secondary | ICD-10-CM | POA: Diagnosis not present

## 2022-12-21 DIAGNOSIS — Z96652 Presence of left artificial knee joint: Secondary | ICD-10-CM | POA: Diagnosis not present

## 2022-12-24 DIAGNOSIS — M6281 Muscle weakness (generalized): Secondary | ICD-10-CM | POA: Diagnosis not present

## 2022-12-24 DIAGNOSIS — Z96652 Presence of left artificial knee joint: Secondary | ICD-10-CM | POA: Diagnosis not present

## 2022-12-24 DIAGNOSIS — R600 Localized edema: Secondary | ICD-10-CM | POA: Diagnosis not present

## 2022-12-24 DIAGNOSIS — S83412A Sprain of medial collateral ligament of left knee, initial encounter: Secondary | ICD-10-CM | POA: Diagnosis not present

## 2022-12-27 DIAGNOSIS — M6281 Muscle weakness (generalized): Secondary | ICD-10-CM | POA: Diagnosis not present

## 2022-12-27 DIAGNOSIS — R600 Localized edema: Secondary | ICD-10-CM | POA: Diagnosis not present

## 2022-12-27 DIAGNOSIS — Z96652 Presence of left artificial knee joint: Secondary | ICD-10-CM | POA: Diagnosis not present

## 2022-12-27 DIAGNOSIS — S83412A Sprain of medial collateral ligament of left knee, initial encounter: Secondary | ICD-10-CM | POA: Diagnosis not present

## 2023-01-03 DIAGNOSIS — S83412A Sprain of medial collateral ligament of left knee, initial encounter: Secondary | ICD-10-CM | POA: Diagnosis not present

## 2023-01-03 DIAGNOSIS — Z96652 Presence of left artificial knee joint: Secondary | ICD-10-CM | POA: Diagnosis not present

## 2023-01-03 DIAGNOSIS — M6281 Muscle weakness (generalized): Secondary | ICD-10-CM | POA: Diagnosis not present

## 2023-01-03 DIAGNOSIS — R600 Localized edema: Secondary | ICD-10-CM | POA: Diagnosis not present

## 2023-01-09 DIAGNOSIS — M6281 Muscle weakness (generalized): Secondary | ICD-10-CM | POA: Diagnosis not present

## 2023-01-09 DIAGNOSIS — Z96652 Presence of left artificial knee joint: Secondary | ICD-10-CM | POA: Diagnosis not present

## 2023-01-09 DIAGNOSIS — S83412A Sprain of medial collateral ligament of left knee, initial encounter: Secondary | ICD-10-CM | POA: Diagnosis not present

## 2023-01-09 DIAGNOSIS — R600 Localized edema: Secondary | ICD-10-CM | POA: Diagnosis not present

## 2023-01-15 DIAGNOSIS — Z96652 Presence of left artificial knee joint: Secondary | ICD-10-CM | POA: Diagnosis not present

## 2023-01-15 DIAGNOSIS — M6281 Muscle weakness (generalized): Secondary | ICD-10-CM | POA: Diagnosis not present

## 2023-01-15 DIAGNOSIS — R600 Localized edema: Secondary | ICD-10-CM | POA: Diagnosis not present

## 2023-01-15 DIAGNOSIS — S83412A Sprain of medial collateral ligament of left knee, initial encounter: Secondary | ICD-10-CM | POA: Diagnosis not present

## 2023-01-30 DIAGNOSIS — Z96652 Presence of left artificial knee joint: Secondary | ICD-10-CM | POA: Diagnosis not present

## 2023-01-30 DIAGNOSIS — S83412A Sprain of medial collateral ligament of left knee, initial encounter: Secondary | ICD-10-CM | POA: Diagnosis not present

## 2023-01-30 DIAGNOSIS — M6281 Muscle weakness (generalized): Secondary | ICD-10-CM | POA: Diagnosis not present

## 2023-01-30 DIAGNOSIS — R600 Localized edema: Secondary | ICD-10-CM | POA: Diagnosis not present

## 2023-02-04 ENCOUNTER — Encounter: Payer: Self-pay | Admitting: Pain Medicine

## 2023-02-04 DIAGNOSIS — I10 Essential (primary) hypertension: Secondary | ICD-10-CM | POA: Diagnosis not present

## 2023-02-04 DIAGNOSIS — E782 Mixed hyperlipidemia: Secondary | ICD-10-CM | POA: Diagnosis not present

## 2023-02-04 DIAGNOSIS — R7303 Prediabetes: Secondary | ICD-10-CM | POA: Diagnosis not present

## 2023-02-04 DIAGNOSIS — Z0000 Encounter for general adult medical examination without abnormal findings: Secondary | ICD-10-CM | POA: Diagnosis not present

## 2023-02-04 DIAGNOSIS — E559 Vitamin D deficiency, unspecified: Secondary | ICD-10-CM | POA: Diagnosis not present

## 2023-02-04 DIAGNOSIS — Z124 Encounter for screening for malignant neoplasm of cervix: Secondary | ICD-10-CM | POA: Diagnosis not present

## 2023-02-05 ENCOUNTER — Other Ambulatory Visit: Payer: Self-pay | Admitting: Pain Medicine

## 2023-02-05 ENCOUNTER — Encounter: Payer: Self-pay | Admitting: Pain Medicine

## 2023-02-05 DIAGNOSIS — M6281 Muscle weakness (generalized): Secondary | ICD-10-CM | POA: Diagnosis not present

## 2023-02-05 DIAGNOSIS — R600 Localized edema: Secondary | ICD-10-CM | POA: Diagnosis not present

## 2023-02-05 DIAGNOSIS — Z780 Asymptomatic menopausal state: Secondary | ICD-10-CM

## 2023-02-05 DIAGNOSIS — Z96652 Presence of left artificial knee joint: Secondary | ICD-10-CM | POA: Diagnosis not present

## 2023-02-05 DIAGNOSIS — S83412A Sprain of medial collateral ligament of left knee, initial encounter: Secondary | ICD-10-CM | POA: Diagnosis not present

## 2023-03-07 DIAGNOSIS — Z96652 Presence of left artificial knee joint: Secondary | ICD-10-CM | POA: Diagnosis not present

## 2023-05-08 DIAGNOSIS — J309 Allergic rhinitis, unspecified: Secondary | ICD-10-CM | POA: Diagnosis not present

## 2023-05-08 DIAGNOSIS — F411 Generalized anxiety disorder: Secondary | ICD-10-CM | POA: Diagnosis not present

## 2023-05-08 DIAGNOSIS — D473 Essential (hemorrhagic) thrombocythemia: Secondary | ICD-10-CM | POA: Diagnosis not present

## 2023-05-08 DIAGNOSIS — E782 Mixed hyperlipidemia: Secondary | ICD-10-CM | POA: Diagnosis not present

## 2023-05-22 DIAGNOSIS — M1711 Unilateral primary osteoarthritis, right knee: Secondary | ICD-10-CM | POA: Diagnosis not present

## 2023-05-22 DIAGNOSIS — Z96652 Presence of left artificial knee joint: Secondary | ICD-10-CM | POA: Diagnosis not present

## 2023-07-29 ENCOUNTER — Other Ambulatory Visit: Payer: Self-pay | Admitting: Family Medicine

## 2023-07-29 DIAGNOSIS — Z1231 Encounter for screening mammogram for malignant neoplasm of breast: Secondary | ICD-10-CM

## 2023-08-08 ENCOUNTER — Ambulatory Visit
Admission: RE | Admit: 2023-08-08 | Discharge: 2023-08-08 | Disposition: A | Discharge status: Home / Self Care | Payer: BC Managed Care – PPO | Source: Ambulatory Visit | Attending: Pain Medicine | Admitting: Pain Medicine

## 2023-08-08 DIAGNOSIS — Z780 Asymptomatic menopausal state: Secondary | ICD-10-CM

## 2023-08-08 DIAGNOSIS — N958 Other specified menopausal and perimenopausal disorders: Secondary | ICD-10-CM | POA: Diagnosis not present

## 2023-08-08 DIAGNOSIS — M8588 Other specified disorders of bone density and structure, other site: Secondary | ICD-10-CM | POA: Diagnosis not present

## 2023-08-08 DIAGNOSIS — E349 Endocrine disorder, unspecified: Secondary | ICD-10-CM | POA: Diagnosis not present

## 2023-08-22 ENCOUNTER — Ambulatory Visit
Admission: RE | Admit: 2023-08-22 | Discharge: 2023-08-22 | Disposition: A | Discharge status: Home / Self Care | Payer: BC Managed Care – PPO | Source: Ambulatory Visit | Attending: Family Medicine | Admitting: Family Medicine

## 2023-08-22 DIAGNOSIS — Z1231 Encounter for screening mammogram for malignant neoplasm of breast: Secondary | ICD-10-CM | POA: Diagnosis not present

## 2023-10-07 IMAGING — CT CT HEAD W/O CM
4 series · 16 of 47 positions shown, 18 images · non-contrast
Comparison: None.

CLINICAL DATA: Dizziness

EXAM:
CT HEAD WITHOUT CONTRAST
TECHNIQUE: Contiguous axial images were obtained from the base of the skull
through the vertex without intravenous contrast.

[Series 2: head wo · axial · 0.46mm/px · z∈[-121,-1]mm · 7 of 32 slices shown, 9 images]
[im 4/32  brain]
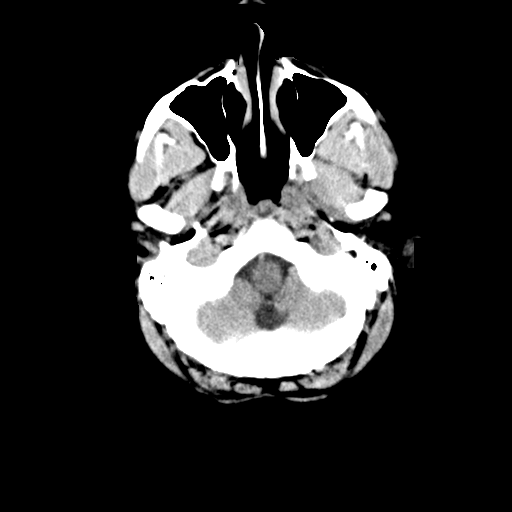
[im 4/32  bone]
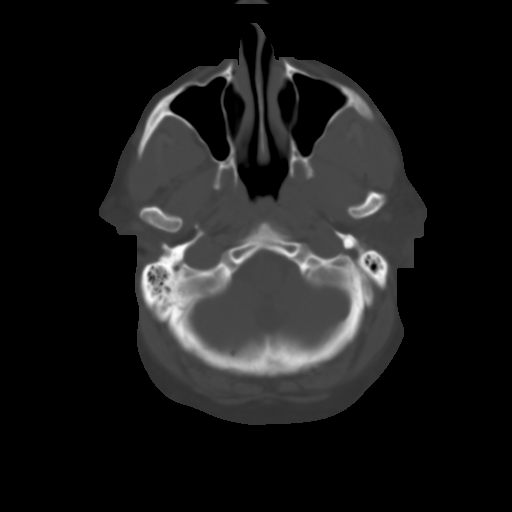
[im 8/32  brain]
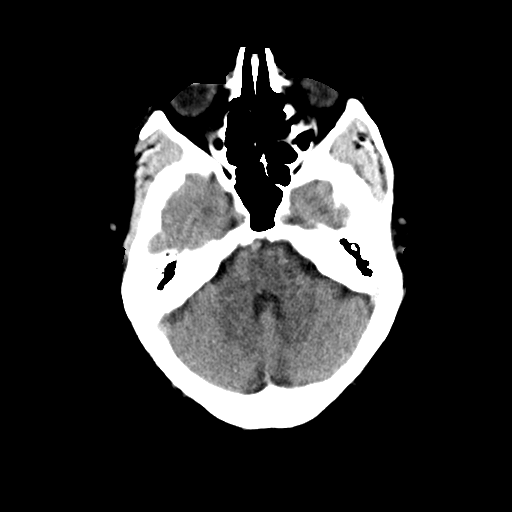
[im 12/32  brain]
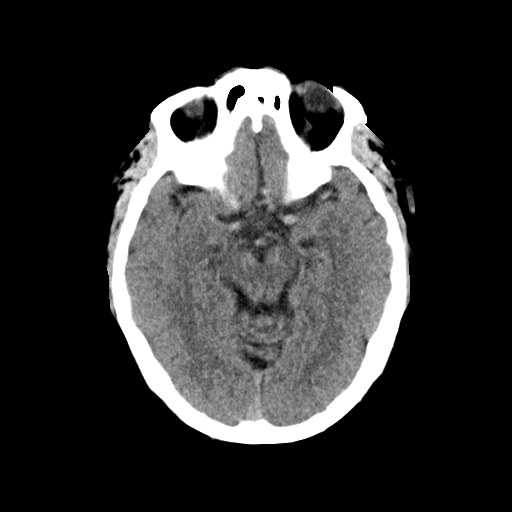
[im 16/32  brain]
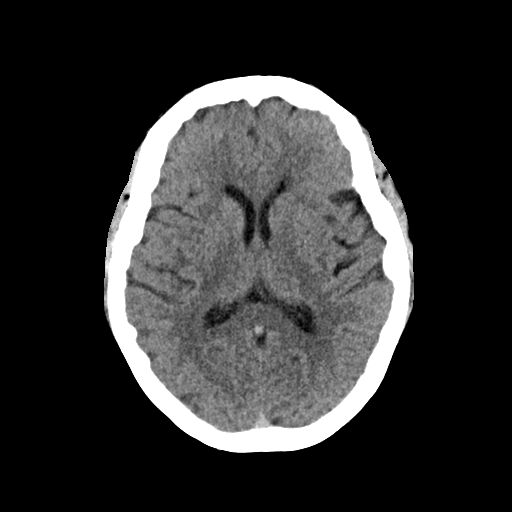
[im 20/32  brain]
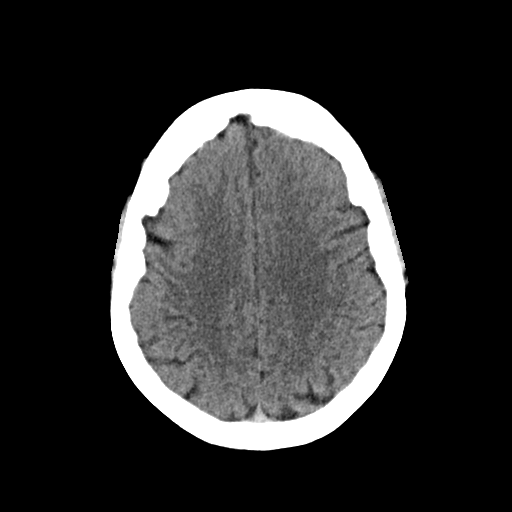
[im 20/32  bone]
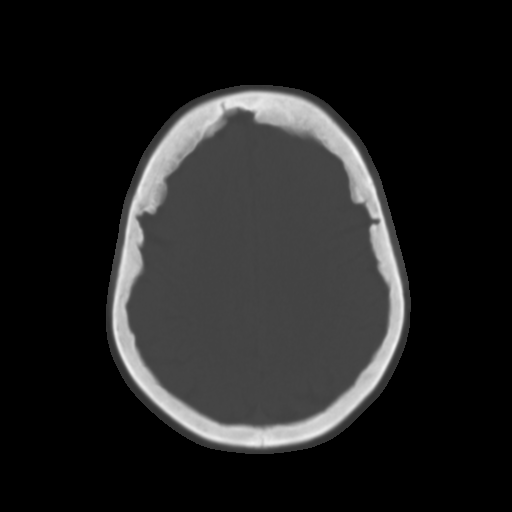
[im 24/32  brain]
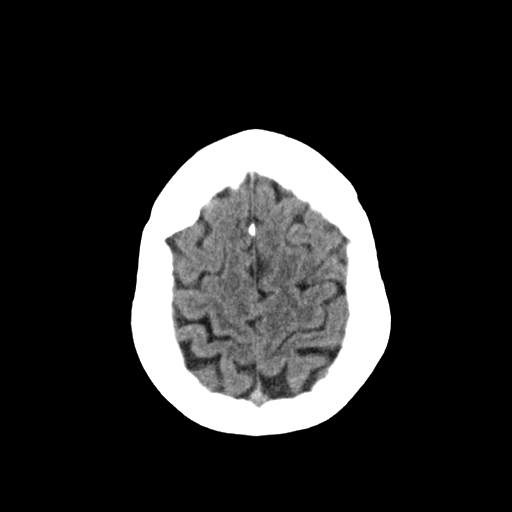
[im 28/32  brain]
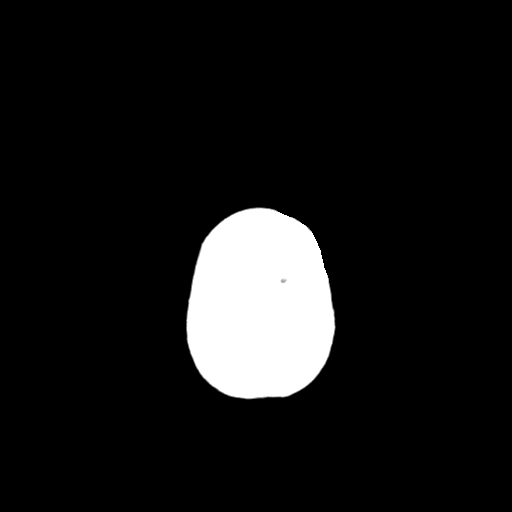

[Series 3: head bone · axial · 0.46mm/px · z∈[-122,-90]mm · 3 of 80 slices shown]
[im 8/80  bone]
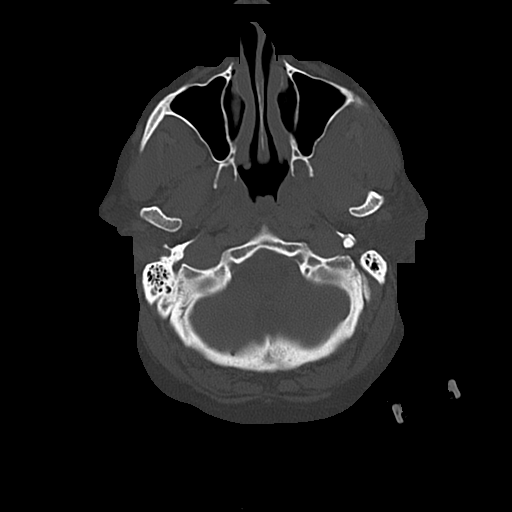
[im 16/80  bone]
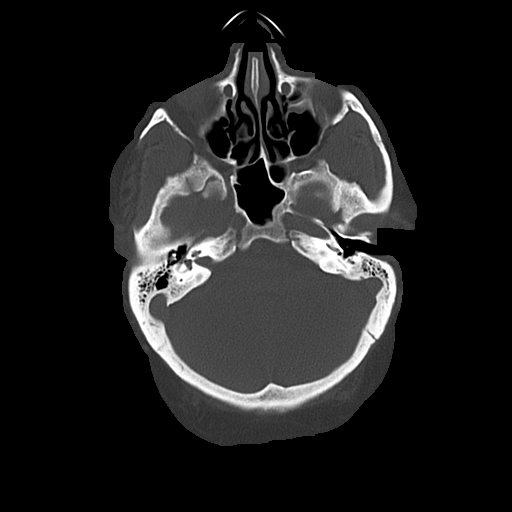
[im 24/80  bone]
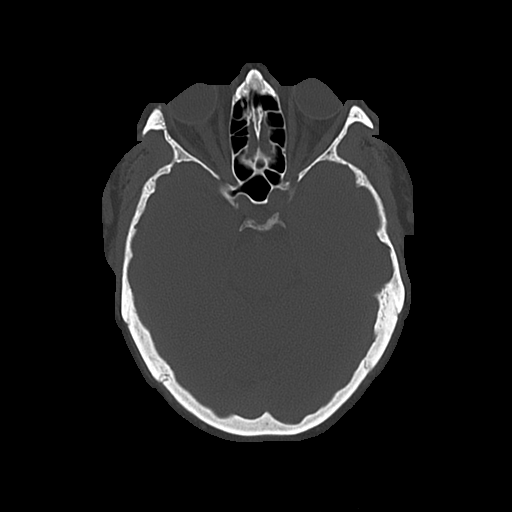

[Series 4: coronal soft · coronal · 0.30mm/px · 3 of 67 slices shown]
[im 23/67  brain]
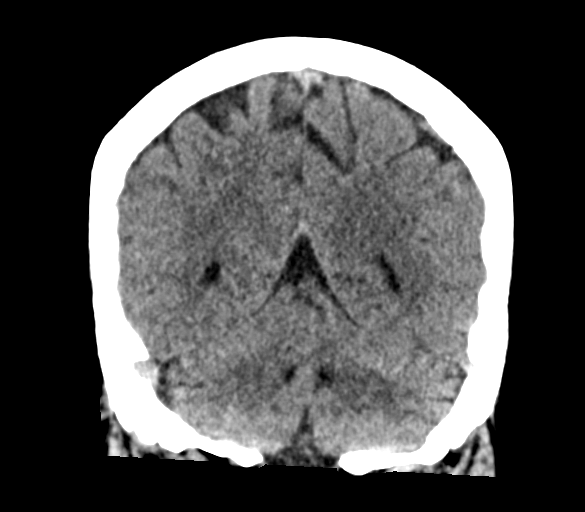
[im 30/67  brain]
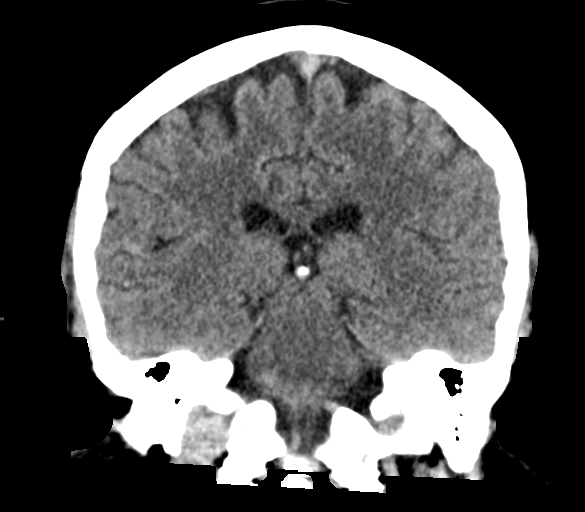
[im 37/67  brain]
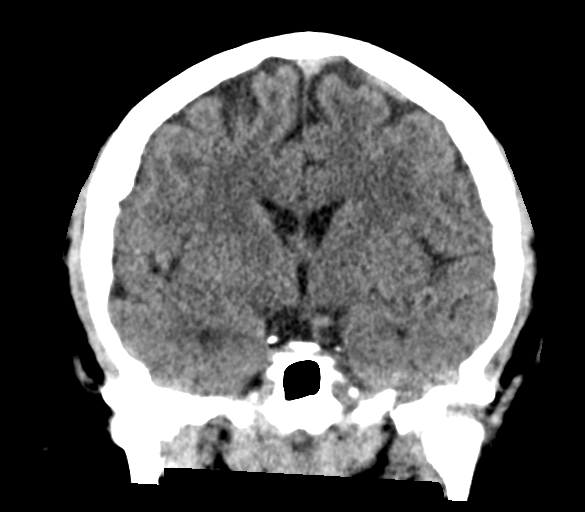

[Series 5: sagittal soft · sagittal · 0.30mm/px · 3 of 53 slices shown]
[im 18/53  brain]
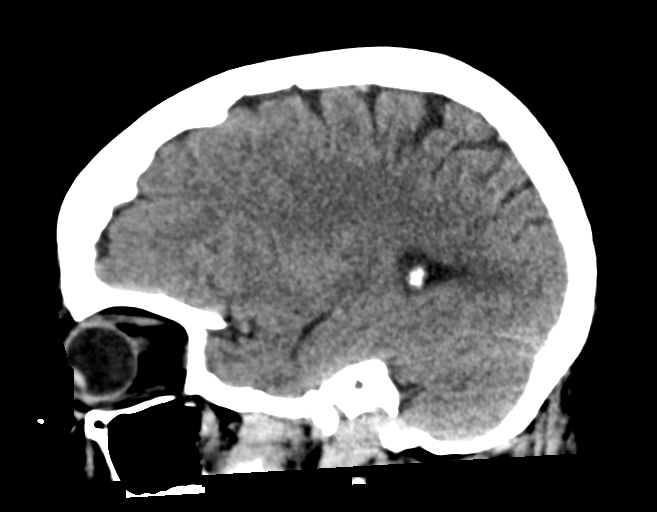
[im 27/53  brain]
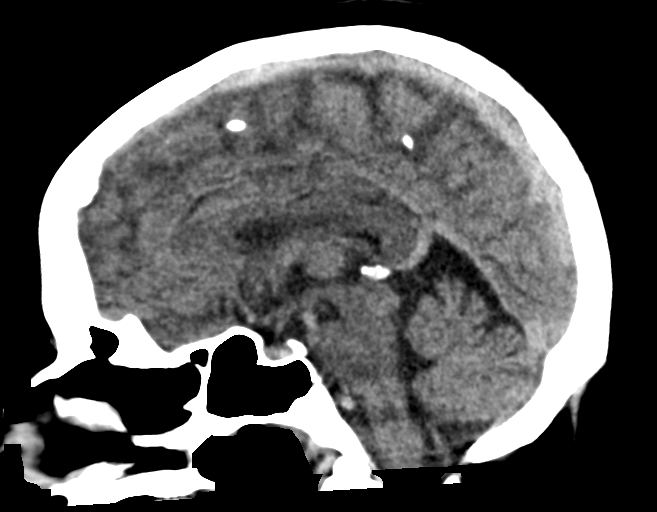
[im 35/53  brain]
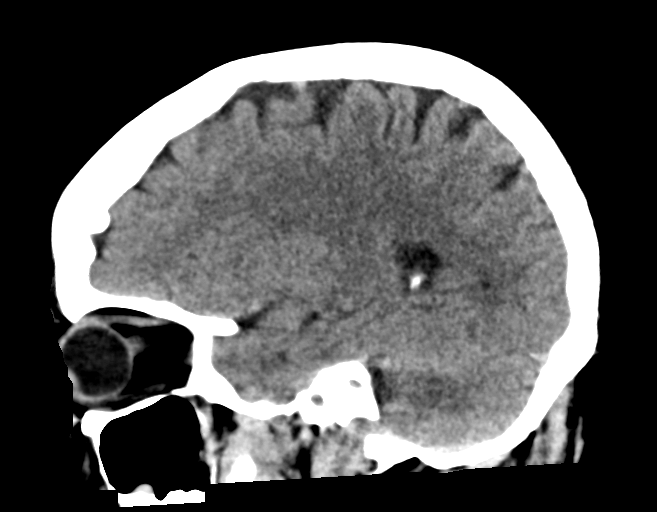

[16 of 47 positions shown; findings below may reference images not displayed]

FINDINGS: Brain: Ventricles are not dilated. There is no shift of midline
structures. There are no epidural or subdural fluid collections.
Cortical sulci are prominent.

Vascular: Unremarkable.

Skull: Unremarkable.

Sinuses/Orbits: Unremarkable.

Other: None
IMPRESSION: No acute intracranial findings are seen in noncontrast CT brain.
There are no signs of bleeding. Cortical sulci are prominent
suggesting atrophy.
